# Patient Record
Sex: Male | Born: 1995
Health system: Southern US, Community
[De-identification: ages and names within clinical notes are randomized; demographics above are authoritative.]

---
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
## 2014-11-16 ENCOUNTER — Ambulatory Visit
Admission: RE | Admit: 2014-11-16 | Discharge: 2014-11-16 | Disposition: A | Discharge status: Home / Self Care | Payer: No Typology Code available for payment source | Source: Ambulatory Visit | Attending: Occupational Medicine | Admitting: Occupational Medicine

## 2014-11-16 ENCOUNTER — Other Ambulatory Visit: Payer: Self-pay | Admitting: Occupational Medicine

## 2014-11-16 DIAGNOSIS — Z021 Encounter for pre-employment examination: Secondary | ICD-10-CM

## 2016-11-02 ENCOUNTER — Encounter (HOSPITAL_COMMUNITY): Payer: Self-pay | Admitting: Emergency Medicine

## 2016-11-02 ENCOUNTER — Emergency Department (HOSPITAL_COMMUNITY): Payer: Worker's Compensation

## 2016-11-02 ENCOUNTER — Emergency Department (HOSPITAL_COMMUNITY)
Admission: EM | Admit: 2016-11-02 | Discharge: 2016-11-02 | Disposition: A | Discharge status: Home / Self Care | Payer: Worker's Compensation | Attending: Emergency Medicine | Admitting: Emergency Medicine

## 2016-11-02 DIAGNOSIS — Y990 Civilian activity done for income or pay: Secondary | ICD-10-CM | POA: Insufficient documentation

## 2016-11-02 DIAGNOSIS — S63501A Unspecified sprain of right wrist, initial encounter: Secondary | ICD-10-CM

## 2016-11-02 DIAGNOSIS — Y929 Unspecified place or not applicable: Secondary | ICD-10-CM | POA: Insufficient documentation

## 2016-11-02 DIAGNOSIS — Y9389 Activity, other specified: Secondary | ICD-10-CM | POA: Diagnosis not present

## 2016-11-02 DIAGNOSIS — S6991XA Unspecified injury of right wrist, hand and finger(s), initial encounter: Secondary | ICD-10-CM | POA: Diagnosis present

## 2016-11-02 NOTE — ED Notes (Signed)
Bed: WTR7 Expected date:  Expected time:  Means of arrival:  Comments: 

## 2016-11-02 NOTE — Discharge Instructions (Signed)
You may alternate Tylenol 1000 mg every 6 hours as needed for pain and Ibuprofen 800 mg every 8 hours as needed for pain.  Please take Ibuprofen with food. ° °
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
## 2016-11-02 NOTE — ED Notes (Signed)
Bed: WA03 Expected date:  Expected time:  Means of arrival:  Comments: 

## 2016-11-02 NOTE — ED Notes (Signed)
Pt verbalized understanding discharge instructions and denies any further needs or questions at this time. VS stable, ambulatory and steady gait.   Pt declines a splint for wrist.

## 2019-03-23 ENCOUNTER — Other Ambulatory Visit: Payer: Self-pay

## 2019-03-23 ENCOUNTER — Encounter (HOSPITAL_COMMUNITY): Payer: Self-pay

## 2019-03-23 ENCOUNTER — Emergency Department (HOSPITAL_COMMUNITY)
Admission: EM | Admit: 2019-03-23 | Discharge: 2019-03-24 | Disposition: A | Discharge status: Home / Self Care | Payer: No Typology Code available for payment source | Attending: Emergency Medicine | Admitting: Emergency Medicine

## 2019-03-23 DIAGNOSIS — Y990 Civilian activity done for income or pay: Secondary | ICD-10-CM | POA: Insufficient documentation

## 2019-03-23 DIAGNOSIS — W2211XA Striking against or struck by driver side automobile airbag, initial encounter: Secondary | ICD-10-CM | POA: Diagnosis not present

## 2019-03-23 DIAGNOSIS — Y939 Activity, unspecified: Secondary | ICD-10-CM | POA: Insufficient documentation

## 2019-03-23 DIAGNOSIS — S4991XA Unspecified injury of right shoulder and upper arm, initial encounter: Secondary | ICD-10-CM | POA: Diagnosis present

## 2019-03-23 DIAGNOSIS — R220 Localized swelling, mass and lump, head: Secondary | ICD-10-CM | POA: Diagnosis not present

## 2019-03-23 DIAGNOSIS — Y92410 Unspecified street and highway as the place of occurrence of the external cause: Secondary | ICD-10-CM | POA: Insufficient documentation

## 2019-03-23 DIAGNOSIS — S46101A Unspecified injury of muscle, fascia and tendon of long head of biceps, right arm, initial encounter: Secondary | ICD-10-CM
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
## 2019-03-24 ENCOUNTER — Emergency Department (HOSPITAL_COMMUNITY): Payer: Self-pay | Attending: Emergency Medicine

## 2019-03-24 ENCOUNTER — Ambulatory Visit
Admission: RE | Admit: 2019-03-24 | Discharge: 2019-03-24 | Disposition: A | Discharge status: Home / Self Care | Payer: Worker's Compensation | Source: Ambulatory Visit | Attending: Nurse Practitioner | Admitting: Nurse Practitioner

## 2019-03-24 ENCOUNTER — Emergency Department (HOSPITAL_COMMUNITY): Payer: Self-pay

## 2019-03-24 ENCOUNTER — Emergency Department (HOSPITAL_COMMUNITY): Payer: No Typology Code available for payment source

## 2019-03-24 ENCOUNTER — Other Ambulatory Visit: Payer: Self-pay | Admitting: Nurse Practitioner

## 2019-03-24 DIAGNOSIS — S43004A Unspecified dislocation of right shoulder joint, initial encounter: Secondary | ICD-10-CM

## 2019-03-24 MED ORDER — METHOCARBAMOL 500 MG PO TABS: 500.0000 mg | ORAL_TABLET | Freq: Two times a day (BID) | ORAL | 0 refills | Status: AC

## 2019-03-24 MED ORDER — ACETAMINOPHEN 500 MG PO TABS
1000.0000 mg | ORAL_TABLET | Freq: Once | ORAL | Status: AC
Start: 2019-03-24 — End: 2019-03-24
  Administered 2019-03-24: 01:00:00 1000 mg via ORAL
  Filled 2019-03-24: qty 2
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
## 2019-03-24 NOTE — ED Notes (Signed)
Patient transported to X-ray 
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
## 2020-01-03 ENCOUNTER — Ambulatory Visit: Payer: Self-pay | Attending: Internal Medicine

## 2020-01-03 DIAGNOSIS — Z23 Encounter for immunization: Secondary | ICD-10-CM

## 2020-01-03 NOTE — Progress Notes (Signed)
   Covid-19 Vaccination Clinic  Name:  John Mercado    MRN: [id number] DOB: 02-Apr-1996  01/03/2020  [name] was observed post Covid-19 immunization for 15 minutes without incident. He was provided with Vaccine Information Sheet and instruction to access the V-Safe system.   [name] was instructed to call 911 with any severe reactions post vaccine: Marland Kitchen Difficulty breathing  . Swelling of face and throat  . A fast heartbeat  . A bad rash all over body  . Dizziness and weakness   Immunizations Administered    Name Date Dose VIS Date Route   Pfizer COVID-19 Vaccine 01/03/2020 10:28 AM 0.3 mL 06/29/2018 Intramuscular   Manufacturer: [employer], [employer]   Lot: [id number]   NDC: [address]-7

## 2020-01-24 ENCOUNTER — Ambulatory Visit: Payer: Self-pay | Attending: Internal Medicine

## 2020-01-24 DIAGNOSIS — Z23 Encounter for immunization: Secondary | ICD-10-CM

## 2020-01-24 NOTE — Progress Notes (Signed)
   Covid-19 Vaccination Clinic  Name:  John Mercado    MRN: [id number] DOB: Oct 14, 1995  01/24/2020  [name] was observed post Covid-19 immunization for 15 minutes without incident. He was provided with Vaccine Information Sheet and instruction to access the V-Safe system.   [name] was instructed to call 911 with any severe reactions post vaccine: Marland Kitchen Difficulty breathing  . Swelling of face and throat  . A fast heartbeat  . A bad rash all over body  . Dizziness and weakness   Immunizations Administered    Name Date Dose VIS Date Route   Pfizer COVID-19 Vaccine 01/24/2020 10:01 AM 0.3 mL 06/29/2018 Intramuscular   Manufacturer: [employer], [employer]   Lot: [id number]   NDC: [address]-2

## 2020-05-29 ENCOUNTER — Emergency Department (HOSPITAL_COMMUNITY)
Admission: EM | Admit: 2020-05-29 | Discharge: 2020-05-29 | Disposition: A | Discharge status: Home / Self Care | Payer: No Typology Code available for payment source | Attending: Emergency Medicine | Admitting: Emergency Medicine

## 2020-05-29 ENCOUNTER — Encounter (HOSPITAL_COMMUNITY): Payer: Self-pay | Admitting: *Deleted

## 2020-05-29 DIAGNOSIS — Z7721 Contact with and (suspected) exposure to potentially hazardous body fluids: Secondary | ICD-10-CM | POA: Diagnosis present

## 2020-05-29 LAB — RAPID HIV SCREEN (HIV 1/2 AB+AG)
HIV 1/2 Antibodies: NONREACTIVE
HIV-1 P24 Antigen - HIV24: NONREACTIVE

## 2020-05-29 NOTE — Discharge Instructions (Addendum)
The average risk of seroconversion to HIV after a single percutaneous (needlestick) exposure to HIV-infected blood is 0.1-0.3%. The risk of transmission through mucous membranes, such as mouth or eyes, is believed to be even lower than this.  We have discussed the risks and benefits of post-exposure HIV prophylaxis. You have elected not to start a PEP regimen at this time. Subsequent HIV tests should be repeated at 6 weeks, 3 months and 6 months following exposure. This can be completed by your primary care doctor.  Return for new or concerning symptoms.
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
## 2020-07-05 ENCOUNTER — Encounter (HOSPITAL_COMMUNITY): Payer: Self-pay | Admitting: *Deleted

## 2021-07-09 ENCOUNTER — Other Ambulatory Visit: Payer: Self-pay | Admitting: Nurse Practitioner

## 2021-07-09 ENCOUNTER — Ambulatory Visit
Admission: RE | Admit: 2021-07-09 | Discharge: 2021-07-09 | Disposition: A | Discharge status: Home / Self Care | Payer: No Typology Code available for payment source | Source: Ambulatory Visit | Attending: Nurse Practitioner | Admitting: Nurse Practitioner

## 2021-07-09 DIAGNOSIS — Z0289 Encounter for other administrative examinations: Secondary | ICD-10-CM

## 2023-05-15 IMAGING — CR DG CHEST 1V
1 series · 1 of 1 positions shown · non-contrast
Comparison: 03/24/2019

CLINICAL DATA: Appointment physical

EXAM:
CHEST  1 VIEW

[w chest pa]
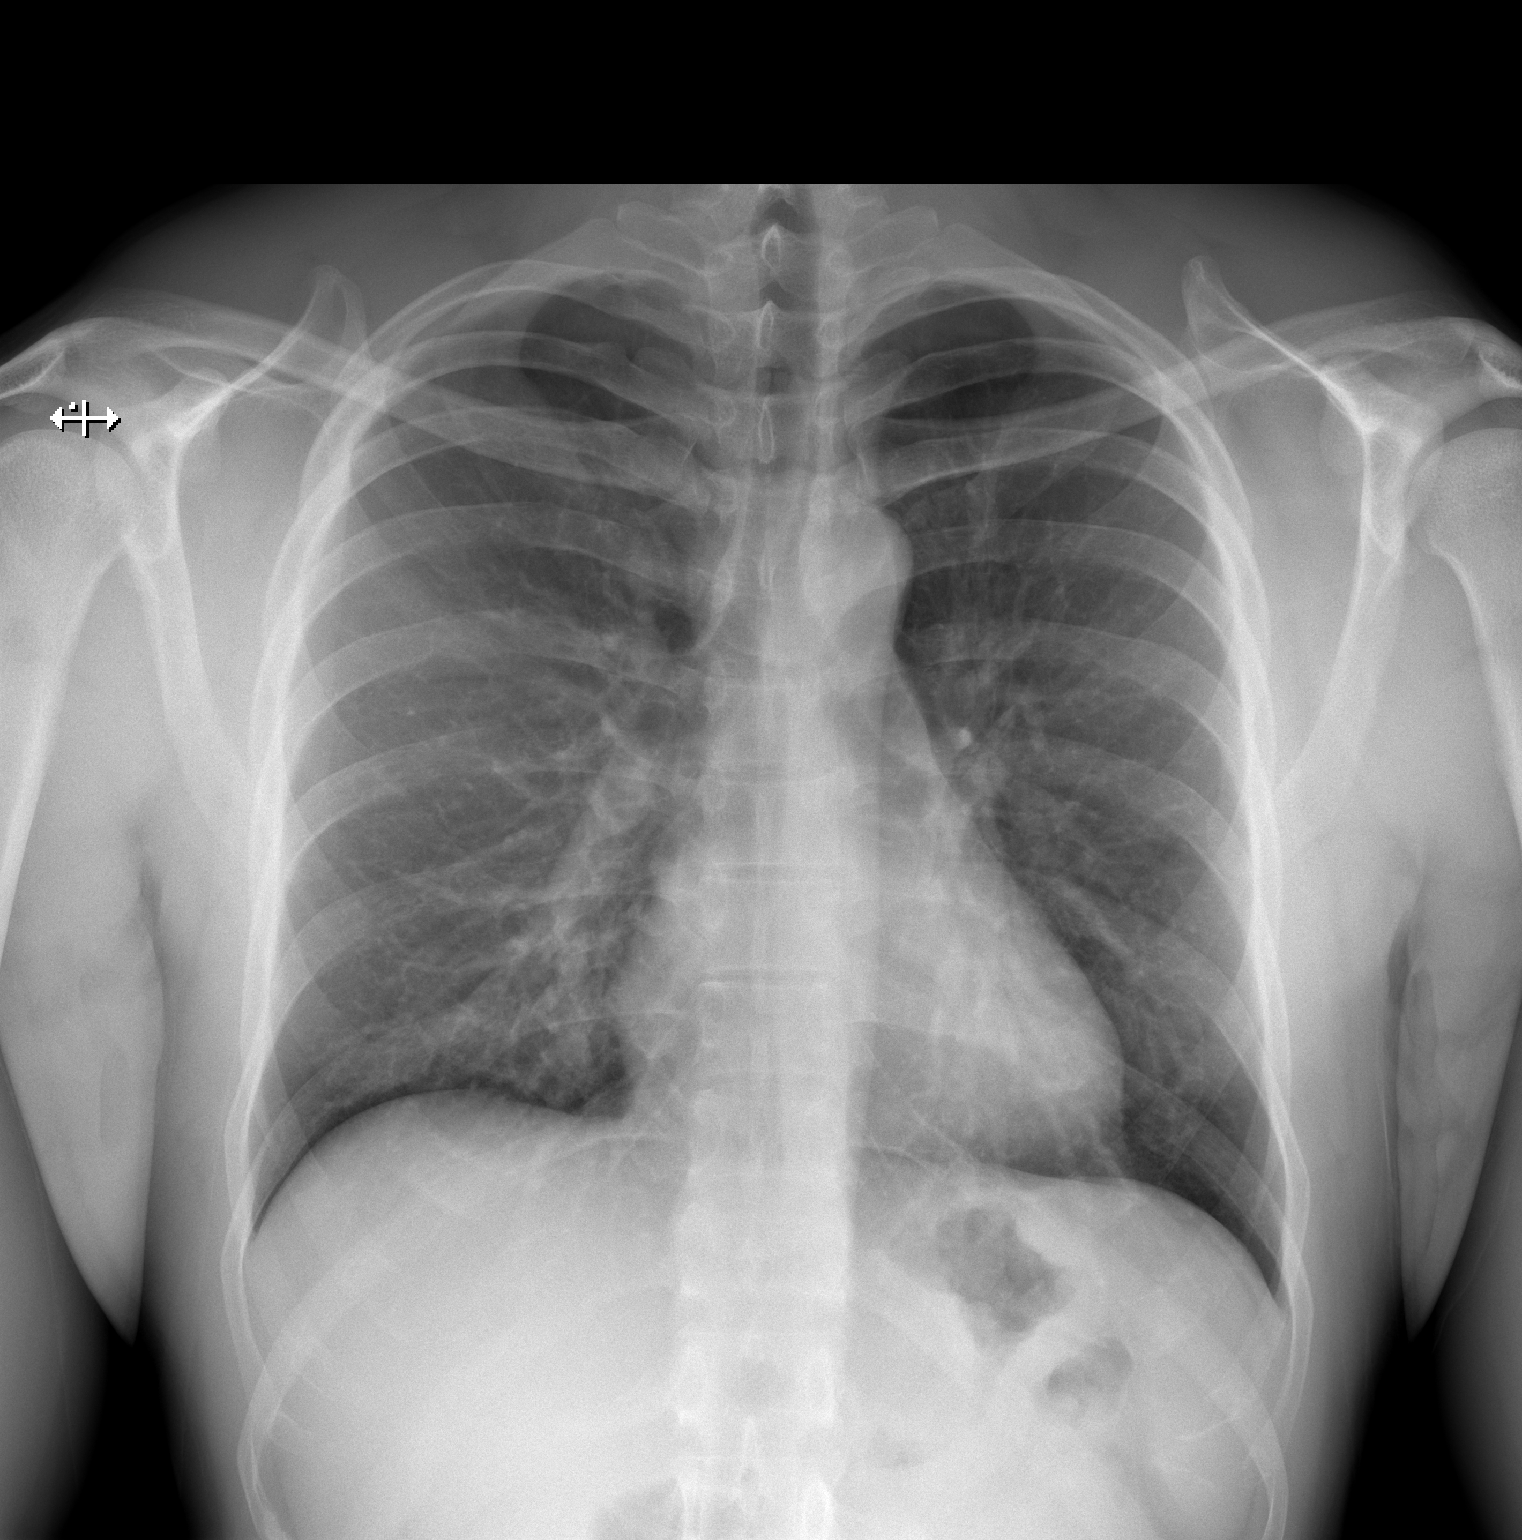

[1 of 1 positions shown; findings below may reference images not displayed]

FINDINGS: The heart size and mediastinal contours are within normal limits.
Both lungs are clear. The visualized skeletal structures are
unremarkable.
IMPRESSION: Normal chest radiograph.
